# Patient Record
Sex: Female | Born: 1990 | Hispanic: Refuse to answer | Marital: Single | Smoking: Never smoker
Health system: Southern US, Community
[De-identification: ages and names within clinical notes are randomized; demographics above are authoritative.]

---

## 2016-11-26 ENCOUNTER — Ambulatory Visit
Admission: RE | Admit: 2016-11-26 | Discharge: 2016-11-26 | Disposition: A | Discharge status: Home / Self Care | Payer: BLUE CROSS/BLUE SHIELD | Source: Ambulatory Visit | Attending: Family Medicine | Admitting: Family Medicine

## 2016-11-26 ENCOUNTER — Ambulatory Visit (INDEPENDENT_AMBULATORY_CARE_PROVIDER_SITE_OTHER): Payer: BLUE CROSS/BLUE SHIELD | Admitting: Family Medicine

## 2016-11-26 ENCOUNTER — Encounter: Payer: Self-pay | Admitting: Family Medicine

## 2016-11-26 DIAGNOSIS — M545 Low back pain, unspecified: Secondary | ICD-10-CM

## 2016-11-26 DIAGNOSIS — S3993XA Unspecified injury of pelvis, initial encounter: Secondary | ICD-10-CM | POA: Diagnosis not present

## 2016-11-26 DIAGNOSIS — X58XXXA Exposure to other specified factors, initial encounter: Secondary | ICD-10-CM | POA: Insufficient documentation

## 2016-11-26 MED ORDER — NAPROXEN 500 MG PO TABS: 500.0000 mg | ORAL_TABLET | Freq: Two times a day (BID) | ORAL | 0 refills | Status: AC

## 2016-11-26 MED ORDER — CYCLOBENZAPRINE HCL 5 MG PO TABS: 5.0000 mg | ORAL_TABLET | Freq: Two times a day (BID) | ORAL | 0 refills | Status: AC

## 2017-01-14 NOTE — Progress Notes (Signed)
Patient is on the cheer team and had an episode where she fell onto someone and has experienced lower back pain since. The incident happened on January 9.patient denies any radicular symptoms, incontinence, foot drop, fever/chills. Patient states that she did see a professor of hers that did manipulate her back which has helped. She describes her back being in a "spasm."  ROS: Negative except mentioned above. Vitals as per Epic. GENERAL: mild to moderate discomfort standing in flexion position RESP: CTA B CARD: RRR MSK: No midline tenderness, bilateral SI tenderness, decreased range of motion due to discomfort, mild spasm throughout lower paravertebral lumbar area, negative straight leg raise, negative foot drop, 5 out of 5 strength of lower extremities, NV intact NEURO: CN II-XII grossly intact    A/P: Lower back injury - will do x-rays of pelvis and lumbar spine, would recommend treatment with muscle relaxant and anti-inflammatory, ice/heat when necessary, monitor for any worsening symptoms, no activity for now until full range of motion and pain has decreased, follow-up with trainer for interval change daily. Seek medical attention if symptoms persist or worsen as discussed.

## 2017-11-13 IMAGING — CR DG LUMBAR SPINE COMPLETE 4+V
1 series · 5 of 5 positions shown · non-contrast
Comparison: None.

CLINICAL DATA: Status post cheerleading fall on [REDACTED] with low
back pain and left pelvic pain

EXAM:
LUMBAR SPINE - COMPLETE 4+ VIEW

[Series 1: dg lumbar spine complete 4 +v · 0.14mm/px · 5 of 5 slices shown]
[im 1/5]
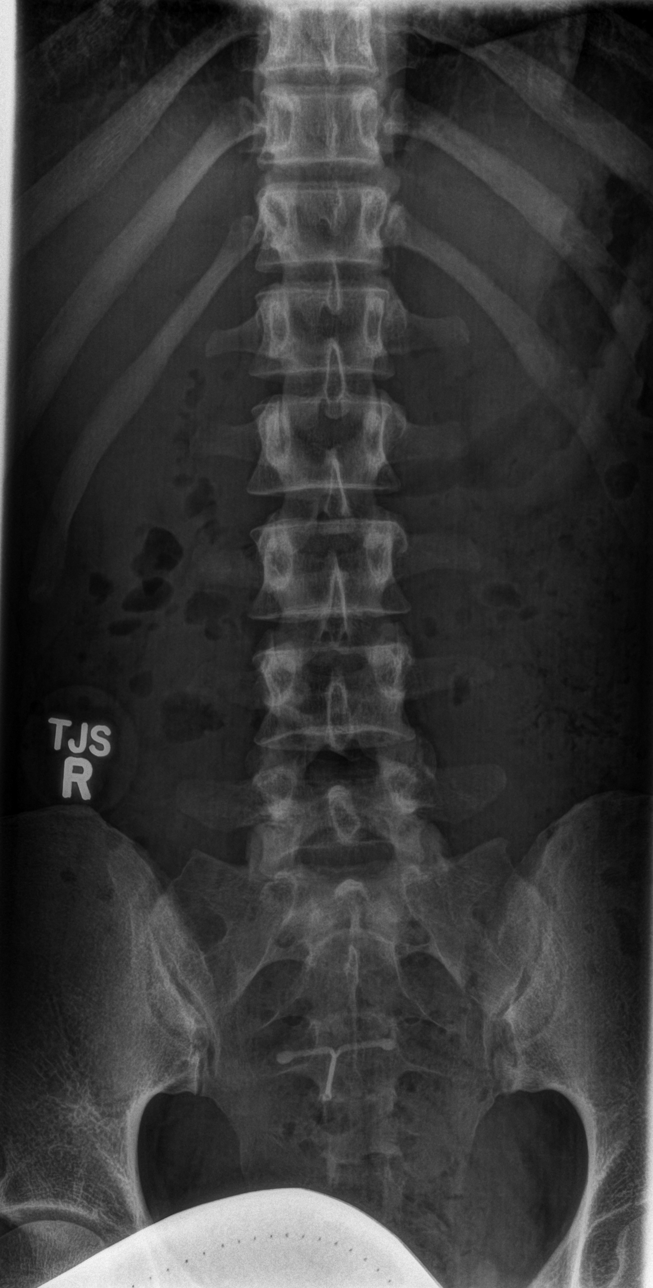
[im 2/5]
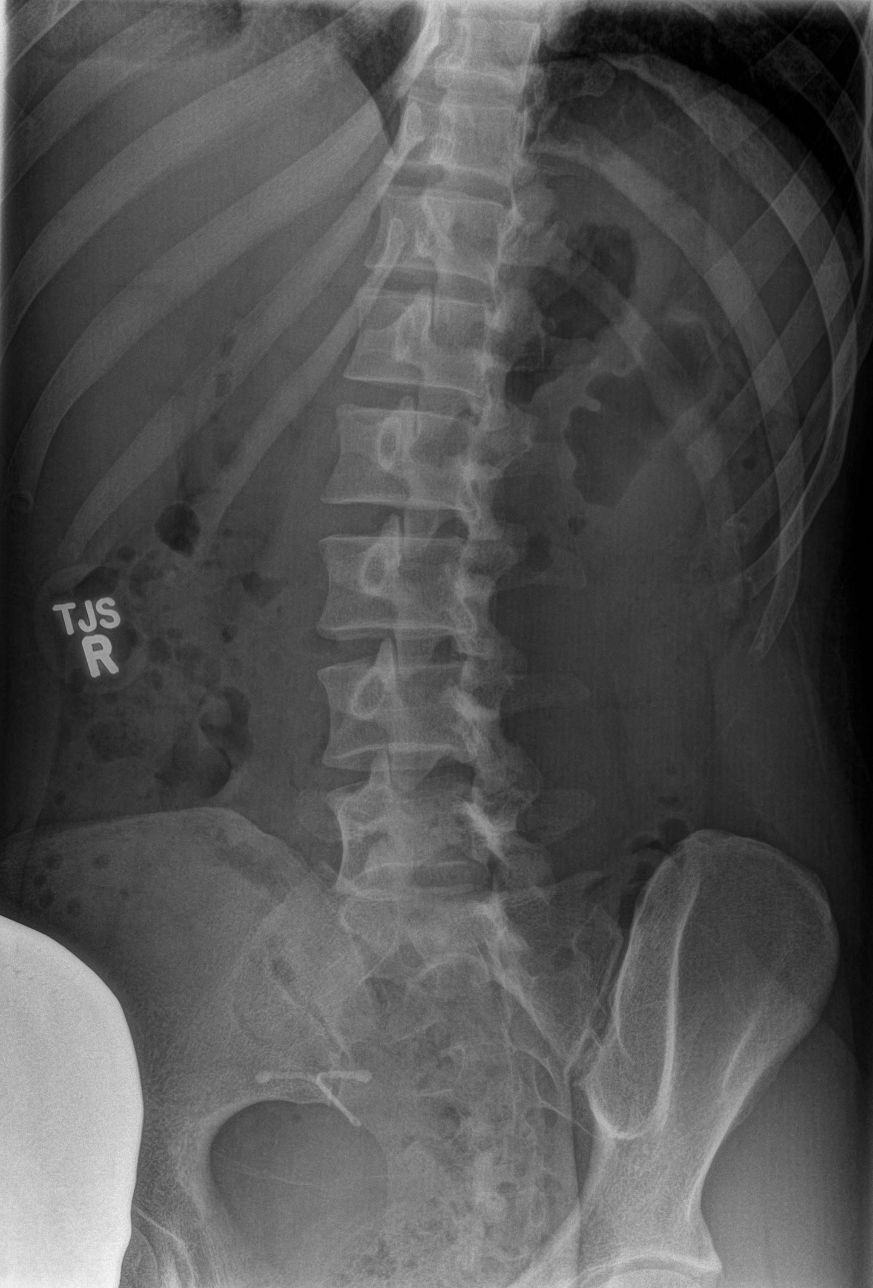
[im 3/5]
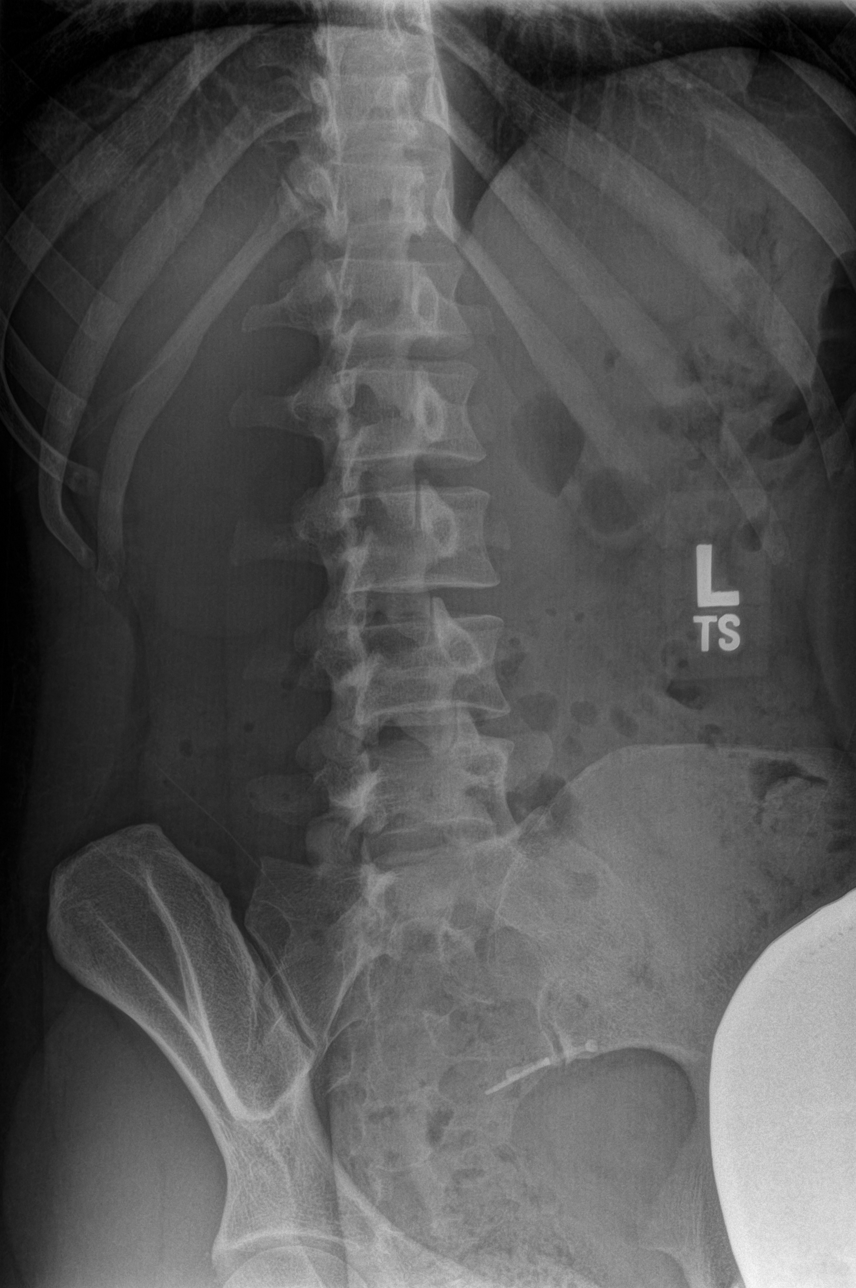
[im 4/5]
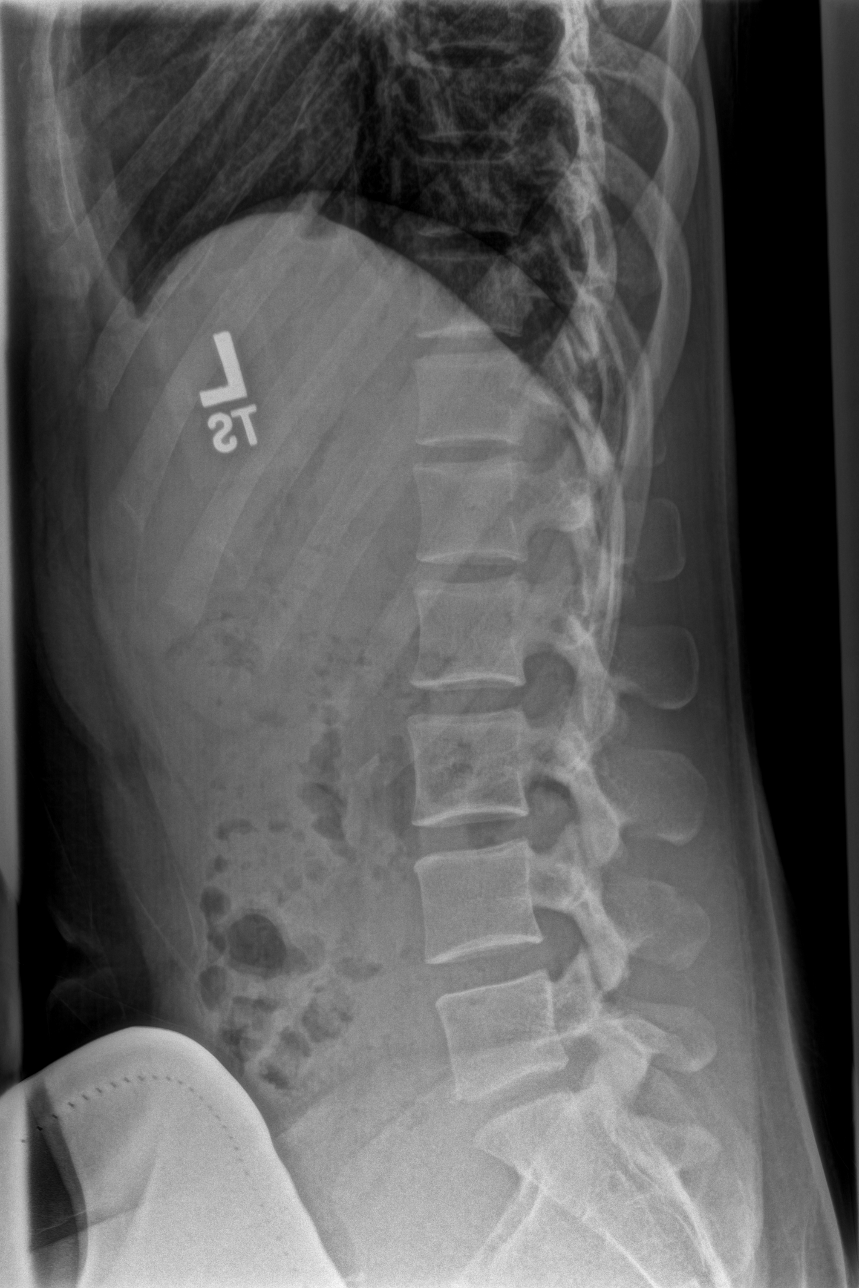
[im 5/5]
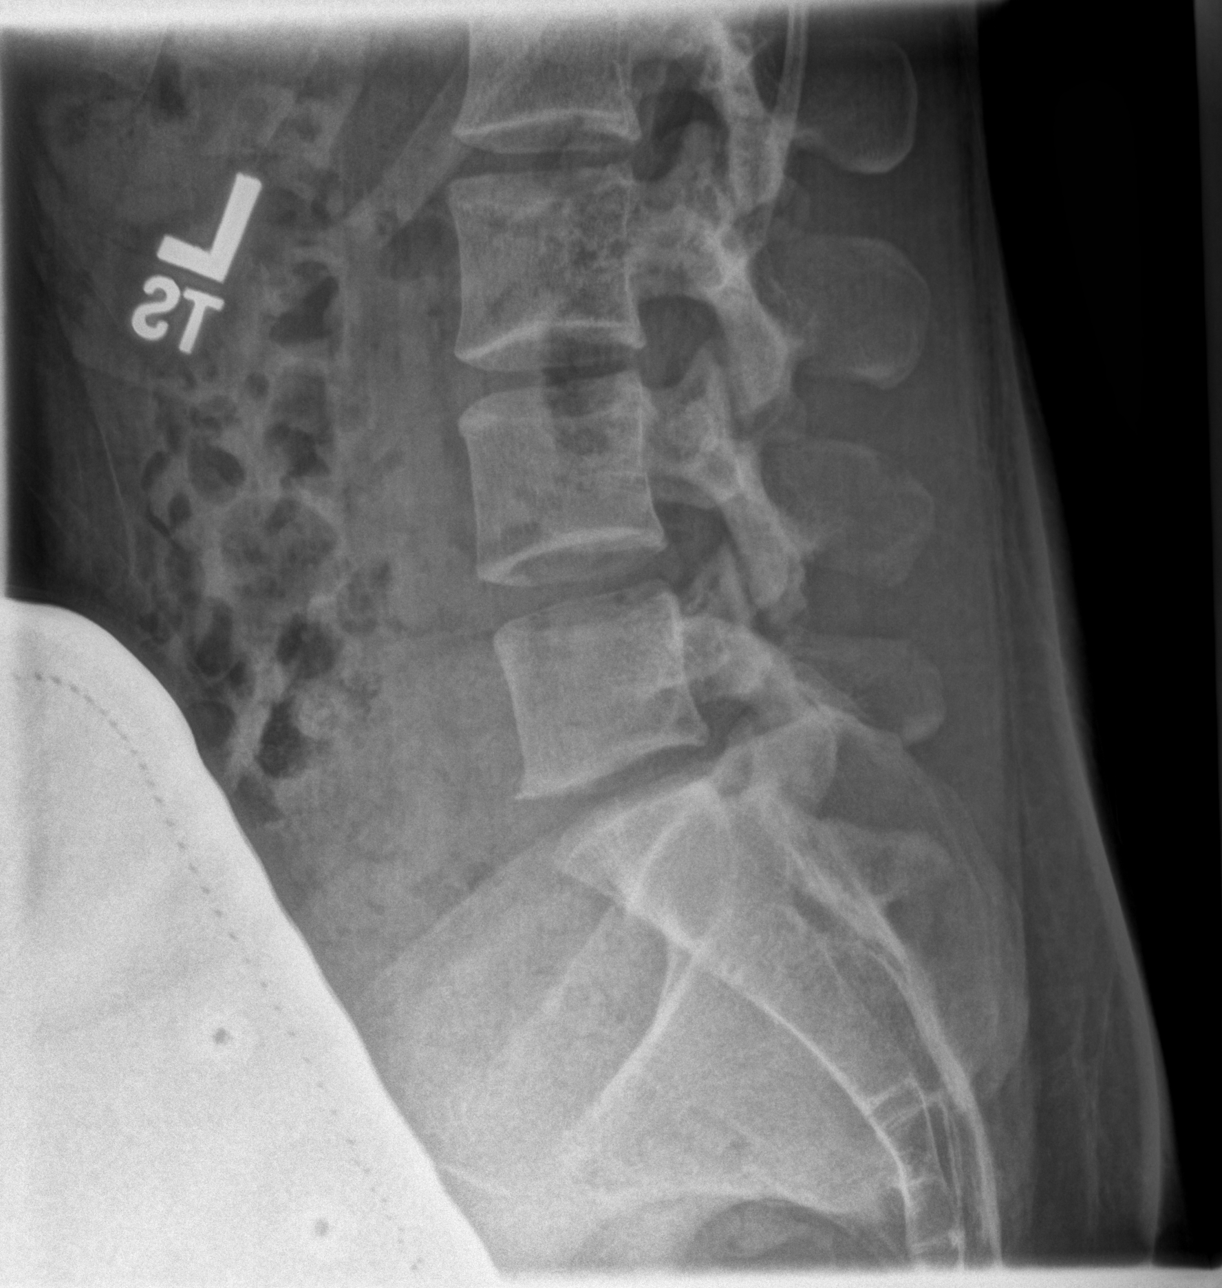

[5 of 5 positions shown; findings below may reference images not displayed]

FINDINGS: There is no evidence of lumbar spine fracture. Alignment is normal.
Intervertebral disc spaces are maintained. IUD is identified.
IMPRESSION: No acute fracture or dislocation.
